# Patient Record
Sex: Male | Born: 1945 | Hispanic: Yes | Marital: Married | Smoking: Never smoker
Health system: Southern US, Community
[De-identification: ages and names within clinical notes are randomized; demographics above are authoritative.]

## PROBLEM LIST (undated history)

## (undated) DIAGNOSIS — J45909 Unspecified asthma, uncomplicated: Secondary | ICD-10-CM

## (undated) HISTORY — PX: NO PAST SURGERIES: SHX2092

---

## 2012-12-15 DIAGNOSIS — J301 Allergic rhinitis due to pollen: Secondary | ICD-10-CM | POA: Diagnosis not present

## 2012-12-15 DIAGNOSIS — H4011X0 Primary open-angle glaucoma, stage unspecified: Secondary | ICD-10-CM | POA: Diagnosis not present

## 2012-12-15 DIAGNOSIS — K1370 Unspecified lesions of oral mucosa: Secondary | ICD-10-CM | POA: Diagnosis not present

## 2012-12-15 DIAGNOSIS — J45909 Unspecified asthma, uncomplicated: Secondary | ICD-10-CM | POA: Diagnosis not present

## 2012-12-15 DIAGNOSIS — Z833 Family history of diabetes mellitus: Secondary | ICD-10-CM | POA: Diagnosis not present

## 2013-01-26 DIAGNOSIS — J45909 Unspecified asthma, uncomplicated: Secondary | ICD-10-CM | POA: Diagnosis not present

## 2013-01-26 DIAGNOSIS — H4011X0 Primary open-angle glaucoma, stage unspecified: Secondary | ICD-10-CM | POA: Diagnosis not present

## 2013-01-26 DIAGNOSIS — J301 Allergic rhinitis due to pollen: Secondary | ICD-10-CM | POA: Diagnosis not present

## 2013-01-26 DIAGNOSIS — M549 Dorsalgia, unspecified: Secondary | ICD-10-CM | POA: Diagnosis not present

## 2013-02-27 DIAGNOSIS — M545 Low back pain: Secondary | ICD-10-CM | POA: Diagnosis not present

## 2013-03-21 ENCOUNTER — Ambulatory Visit: Payer: Self-pay | Admitting: Nurse Practitioner

## 2013-03-21 DIAGNOSIS — M47817 Spondylosis without myelopathy or radiculopathy, lumbosacral region: Secondary | ICD-10-CM | POA: Diagnosis not present

## 2013-03-21 DIAGNOSIS — M5126 Other intervertebral disc displacement, lumbar region: Secondary | ICD-10-CM | POA: Diagnosis not present

## 2013-03-21 DIAGNOSIS — M545 Low back pain: Secondary | ICD-10-CM | POA: Diagnosis not present

## 2013-06-13 DIAGNOSIS — M25519 Pain in unspecified shoulder: Secondary | ICD-10-CM | POA: Diagnosis not present

## 2013-06-13 DIAGNOSIS — J45909 Unspecified asthma, uncomplicated: Secondary | ICD-10-CM | POA: Diagnosis not present

## 2013-06-13 DIAGNOSIS — M5137 Other intervertebral disc degeneration, lumbosacral region: Secondary | ICD-10-CM | POA: Diagnosis not present

## 2013-06-19 DIAGNOSIS — M67919 Unspecified disorder of synovium and tendon, unspecified shoulder: Secondary | ICD-10-CM | POA: Diagnosis not present

## 2013-07-07 ENCOUNTER — Ambulatory Visit: Payer: Self-pay | Admitting: Unknown Physician Specialty

## 2013-07-07 DIAGNOSIS — M19019 Primary osteoarthritis, unspecified shoulder: Secondary | ICD-10-CM | POA: Diagnosis not present

## 2013-07-07 DIAGNOSIS — M25519 Pain in unspecified shoulder: Secondary | ICD-10-CM | POA: Diagnosis not present

## 2013-07-07 DIAGNOSIS — S46919A Strain of unspecified muscle, fascia and tendon at shoulder and upper arm level, unspecified arm, initial encounter: Secondary | ICD-10-CM | POA: Diagnosis not present

## 2013-09-27 DIAGNOSIS — Z125 Encounter for screening for malignant neoplasm of prostate: Secondary | ICD-10-CM | POA: Diagnosis not present

## 2013-09-27 DIAGNOSIS — N529 Male erectile dysfunction, unspecified: Secondary | ICD-10-CM | POA: Diagnosis not present

## 2013-09-27 DIAGNOSIS — J45909 Unspecified asthma, uncomplicated: Secondary | ICD-10-CM | POA: Diagnosis not present

## 2013-09-27 DIAGNOSIS — Z0000 Encounter for general adult medical examination without abnormal findings: Secondary | ICD-10-CM | POA: Diagnosis not present

## 2014-01-15 DIAGNOSIS — J45909 Unspecified asthma, uncomplicated: Secondary | ICD-10-CM | POA: Diagnosis not present

## 2014-01-15 DIAGNOSIS — M25569 Pain in unspecified knee: Secondary | ICD-10-CM | POA: Diagnosis not present

## 2014-01-19 DIAGNOSIS — M1710 Unilateral primary osteoarthritis, unspecified knee: Secondary | ICD-10-CM | POA: Diagnosis not present

## 2014-01-19 DIAGNOSIS — M25569 Pain in unspecified knee: Secondary | ICD-10-CM | POA: Diagnosis not present

## 2014-09-12 DIAGNOSIS — M25561 Pain in right knee: Secondary | ICD-10-CM | POA: Diagnosis not present

## 2014-09-12 DIAGNOSIS — N529 Male erectile dysfunction, unspecified: Secondary | ICD-10-CM | POA: Diagnosis not present

## 2014-10-03 DIAGNOSIS — M5136 Other intervertebral disc degeneration, lumbar region: Secondary | ICD-10-CM | POA: Diagnosis not present

## 2014-10-03 DIAGNOSIS — Z79899 Other long term (current) drug therapy: Secondary | ICD-10-CM | POA: Diagnosis not present

## 2014-10-03 DIAGNOSIS — Z09 Encounter for follow-up examination after completed treatment for conditions other than malignant neoplasm: Secondary | ICD-10-CM | POA: Diagnosis not present

## 2014-10-03 DIAGNOSIS — N400 Enlarged prostate without lower urinary tract symptoms: Secondary | ICD-10-CM | POA: Diagnosis not present

## 2014-10-03 DIAGNOSIS — M25561 Pain in right knee: Secondary | ICD-10-CM | POA: Diagnosis not present

## 2014-10-03 DIAGNOSIS — N529 Male erectile dysfunction, unspecified: Secondary | ICD-10-CM | POA: Diagnosis not present

## 2014-12-13 DIAGNOSIS — H40003 Preglaucoma, unspecified, bilateral: Secondary | ICD-10-CM | POA: Diagnosis not present

## 2015-05-29 ENCOUNTER — Encounter: Payer: Self-pay | Admitting: Internal Medicine

## 2015-05-31 ENCOUNTER — Other Ambulatory Visit: Payer: Self-pay | Admitting: Internal Medicine

## 2015-05-31 ENCOUNTER — Encounter: Payer: Self-pay | Admitting: Internal Medicine

## 2015-05-31 DIAGNOSIS — N400 Enlarged prostate without lower urinary tract symptoms: Secondary | ICD-10-CM | POA: Insufficient documentation

## 2015-05-31 DIAGNOSIS — M5136 Other intervertebral disc degeneration, lumbar region: Secondary | ICD-10-CM | POA: Insufficient documentation

## 2015-05-31 DIAGNOSIS — M25519 Pain in unspecified shoulder: Secondary | ICD-10-CM | POA: Insufficient documentation

## 2015-05-31 DIAGNOSIS — N529 Male erectile dysfunction, unspecified: Secondary | ICD-10-CM | POA: Insufficient documentation

## 2015-05-31 DIAGNOSIS — M25569 Pain in unspecified knee: Secondary | ICD-10-CM | POA: Insufficient documentation

## 2015-05-31 DIAGNOSIS — J45909 Unspecified asthma, uncomplicated: Secondary | ICD-10-CM | POA: Insufficient documentation

## 2015-06-04 ENCOUNTER — Ambulatory Visit (INDEPENDENT_AMBULATORY_CARE_PROVIDER_SITE_OTHER): Payer: Medicare Other | Admitting: Internal Medicine

## 2015-06-04 ENCOUNTER — Encounter: Payer: Self-pay | Admitting: Internal Medicine

## 2015-06-04 VITALS — BP 130/72 | HR 80 | Ht 72.0 in | Wt 206.2 lb

## 2015-06-04 DIAGNOSIS — J4520 Mild intermittent asthma, uncomplicated: Secondary | ICD-10-CM

## 2015-06-04 MED ORDER — BUDESONIDE 90 MCG/ACT IN AEPB: 1.0000 | INHALATION_SPRAY | Freq: Two times a day (BID) | RESPIRATORY_TRACT | Status: DC

## 2015-06-04 MED ORDER — ALBUTEROL SULFATE HFA 108 (90 BASE) MCG/ACT IN AERS: 2.0000 | INHALATION_SPRAY | Freq: Four times a day (QID) | RESPIRATORY_TRACT | Status: DC | PRN

## 2015-06-04 NOTE — Progress Notes (Signed)
Date:  06/04/2015   Name:  Evan Williams   DOB:  1946-03-02   MRN:  [id number]   Chief Complaint: Asthma  patient complains of a flareup of his asthma. He attributes this mostly to postnasal drainage for which he takes Claritin-D. He has a Ventolin inhaler that he uses once or twice per day. In the past he was given Elwin Sleight which is not covered on his insurance although it helped his symptoms. It's not clear whether he has ever been on a steroid inhaler alone.  Review of Systems:  Review of Systems  HENT: Positive for sneezing. Negative for sinus pressure, sore throat and trouble swallowing.   Respiratory: Positive for wheezing. Negative for apnea, cough, chest tightness and shortness of breath.   Cardiovascular: Negative for chest pain and palpitations.  Neurological: Negative for headaches.    Patient Active Problem List   Diagnosis Date Noted  . Asthma 05/31/2015  . DDD (degenerative disc disease), lumbar 05/31/2015  . Enlarged prostate 05/31/2015  . ED (erectile dysfunction) of organic origin 05/31/2015  . Gonalgia 05/31/2015  . Pain in shoulder 05/31/2015    Prior to Admission medications   Medication Sig Start Date End Date Taking? Authorizing Provider  albuterol (VENTOLIN HFA) 108 (90 BASE) MCG/ACT inhaler Inhale into the lungs. 09/27/13  Yes Historical Provider, MD  cetirizine (ZYRTEC) 5 MG tablet Take 5 mg by mouth daily.   Yes Historical Provider, MD  mometasone-formoterol (DULERA) 100-5 MCG/ACT AERO Inhale 1 puff into the lungs 2 (two) times daily. 09/12/14  Yes Historical Provider, MD    No Known Allergies  No past surgical history on file.  Social History  Substance Use Topics  . Smoking status: Never Smoker   . Smokeless tobacco: None  . Alcohol Use: No     Medication list has been reviewed and updated.  Physical Examination:  Physical Exam  Constitutional: He is oriented to person, place, and time. He appears well-developed and well-nourished.   HENT:  Right Ear: Tympanic membrane and ear canal normal.  Left Ear: Tympanic membrane and ear canal normal.  Nose: Right sinus exhibits no maxillary sinus tenderness and no frontal sinus tenderness. Left sinus exhibits no maxillary sinus tenderness and no frontal sinus tenderness.  Mouth/Throat: Uvula is midline and oropharynx is clear and moist.  Neck: Normal range of motion. Neck supple. No thyromegaly present.  Cardiovascular: Normal rate, regular rhythm and normal heart sounds.   Pulmonary/Chest: Effort normal and breath sounds normal. No respiratory distress. He has no wheezes.  Musculoskeletal: He exhibits no edema.  Lymphadenopathy:    He has no cervical adenopathy.  Neurological: He is alert and oriented to person, place, and time.  Skin: Skin is warm and dry.  Psychiatric: He has a normal mood and affect. His behavior is normal.    BP 130/72 mmHg  Pulse 80  Ht 6' (1.829 m)  Wt 206 lb 3.2 oz (93.532 kg)  BMI 27.96 kg/m2  SpO2 99%  Assessment and Plan: 1. Asthma, mild intermittent, uncomplicated Noted coverage for dual agents available so will switch to steroid inhaler alone twice a day He can continue to use Ventolin as needed Continue Claritin for allergy symptoms - Budesonide 90 MCG/ACT inhaler; Inhale 1 puff into the lungs 2 (two) times daily.  Dispense: 1 each; Refill: 5 - albuterol (VENTOLIN HFA) 108 (90 BASE) MCG/ACT inhaler; Inhale 2 puffs into the lungs every 6 (six) hours as needed for wheezing or shortness of breath.  Dispense: 18 g; Refill:  11   Bari Edward, MD [facility]  06/04/2015

## 2015-09-02 ENCOUNTER — Other Ambulatory Visit: Payer: Self-pay

## 2015-11-01 ENCOUNTER — Ambulatory Visit (INDEPENDENT_AMBULATORY_CARE_PROVIDER_SITE_OTHER): Payer: Medicare Other | Admitting: Internal Medicine

## 2015-11-01 ENCOUNTER — Encounter: Payer: Self-pay | Admitting: Internal Medicine

## 2015-11-01 VITALS — BP 136/81 | HR 71 | Ht 72.0 in | Wt 209.0 lb

## 2015-11-01 DIAGNOSIS — M5136 Other intervertebral disc degeneration, lumbar region: Secondary | ICD-10-CM | POA: Diagnosis not present

## 2015-11-01 DIAGNOSIS — J4520 Mild intermittent asthma, uncomplicated: Secondary | ICD-10-CM | POA: Diagnosis not present

## 2015-11-01 MED ORDER — DICLOFENAC SODIUM 75 MG PO TBEC: 75.0000 mg | DELAYED_RELEASE_TABLET | Freq: Two times a day (BID) | ORAL | Status: DC

## 2015-11-01 MED ORDER — ALBUTEROL SULFATE HFA 108 (90 BASE) MCG/ACT IN AERS: 2.0000 | INHALATION_SPRAY | Freq: Four times a day (QID) | RESPIRATORY_TRACT | Status: DC | PRN

## 2015-11-01 MED ORDER — MOMETASONE FURO-FORMOTEROL FUM 100-5 MCG/ACT IN AERO: 1.0000 | INHALATION_SPRAY | Freq: Two times a day (BID) | RESPIRATORY_TRACT | Status: DC

## 2015-11-01 NOTE — Progress Notes (Signed)
Date:  11/01/2015   Name:  Evan Williams   DOB:  [birth year]   MRN:  [id number]   Chief Complaint: Asthma Asthma He complains of chest tightness, cough, difficulty breathing and wheezing. There is no shortness of breath or sputum production. This is a recurrent problem. The problem occurs every several days. The problem has been unchanged. The cough is non-productive. Pertinent negatives include no chest pain, fever or headaches. His symptoms are alleviated by steroid inhaler and beta-agonist. He reports significant improvement on treatment. His past medical history is significant for asthma. Past medical history comments: He declines flu and pneumonia vaccines.  Back Pain This is a chronic problem. The problem occurs every several days. The problem has been waxing and waning since onset. The pain is present in the lumbar spine. The quality of the pain is described as aching. Pertinent negatives include no chest pain, fever or headaches. He has tried NSAIDs for the symptoms. The treatment provided moderate (uses voltaren prn) relief.    Review of Systems  Constitutional: Negative for fever, chills and fatigue.  Respiratory: Positive for cough and wheezing. Negative for sputum production, chest tightness, shortness of breath and stridor.   Cardiovascular: Negative for chest pain and palpitations.  Gastrointestinal: Negative for diarrhea, constipation and blood in stool.  Musculoskeletal: Positive for back pain.  Neurological: Negative for dizziness and headaches.    Patient Active Problem List   Diagnosis Date Noted  . Asthma, mild intermittent 11/01/2015  . DDD (degenerative disc disease), lumbar 05/31/2015  . Enlarged prostate 05/31/2015  . ED (erectile dysfunction) of organic origin 05/31/2015  . Gonalgia 05/31/2015  . Pain in shoulder 05/31/2015    Prior to Admission medications   Medication Sig Start Date End Date Taking? Authorizing Provider  albuterol (VENTOLIN HFA)  108 (90 BASE) MCG/ACT inhaler Inhale 2 puffs into the lungs every 6 (six) hours as needed for wheezing or shortness of breath. 06/04/15   Reubin Milan, MD  Budesonide 90 MCG/ACT inhaler Inhale 1 puff into the lungs 2 (two) times daily. 06/04/15   Reubin Milan, MD  cetirizine (ZYRTEC) 5 MG tablet Take 5 mg by mouth daily.    Historical Provider, MD  mometasone-formoterol (DULERA) 100-5 MCG/ACT AERO Inhale 1 puff into the lungs 2 (two) times daily. 09/12/14   Historical Provider, MD    No Known Allergies  Past Surgical History  Procedure Laterality Date  . No past surgeries      Social History  Substance Use Topics  . Smoking status: Never Smoker   . Smokeless tobacco: None  . Alcohol Use: No    Medication list has been reviewed and updated.   Physical Exam  Constitutional: He is oriented to person, place, and time. He appears well-developed. No distress.  HENT:  Head: Normocephalic and atraumatic.  Neck: Normal range of motion. Neck supple.  Cardiovascular: Normal rate, regular rhythm and normal heart sounds.   No murmur heard. Pulmonary/Chest: Effort normal and breath sounds normal. No respiratory distress. He has no wheezes.  Musculoskeletal: Normal range of motion. He exhibits no edema or tenderness.  Neurological: He is alert and oriented to person, place, and time.  Skin: Skin is warm and dry. No rash noted.  Psychiatric: He has a normal mood and affect. His behavior is normal. Thought content normal.    BP 136/81 mmHg  Pulse 71  Ht 6' (1.829 m)  Wt 209 lb (94.802 kg)  BMI 28.34 kg/m2  SpO2 98%  Assessment and Plan: 1. Asthma, mild intermittent, uncomplicated Doing well but Elwin Sleight is not covered (he does not do well with any other medication) - mometasone-formoterol (DULERA) 100-5 MCG/ACT AERO; Inhale 1 puff into the lungs 2 (two) times daily.  Dispense: 13 g; Refill: 5 - albuterol (VENTOLIN HFA) 108 (90 Base) MCG/ACT inhaler; Inhale 2 puffs into the lungs  every 6 (six) hours as needed for wheezing or shortness of breath.  Dispense: 18 g; Refill: 11  2. DDD (degenerative disc disease), lumbar Continue nsaids PRN - diclofenac (VOLTAREN) 75 MG EC tablet; Take 1 tablet (75 mg total) by mouth 2 (two) times daily.  Dispense: 60 tablet; Refill: 5   Bari Edward, MD [facility]  11/01/2015

## 2016-01-14 ENCOUNTER — Ambulatory Visit: Payer: Self-pay | Admitting: Internal Medicine

## 2016-02-04 ENCOUNTER — Ambulatory Visit: Payer: Self-pay | Admitting: Internal Medicine

## 2016-04-08 ENCOUNTER — Ambulatory Visit: Payer: Medicare Other | Admitting: Internal Medicine

## 2016-05-20 ENCOUNTER — Encounter: Payer: Self-pay | Admitting: Internal Medicine

## 2016-05-20 ENCOUNTER — Ambulatory Visit (INDEPENDENT_AMBULATORY_CARE_PROVIDER_SITE_OTHER): Payer: Medicare Other | Admitting: Internal Medicine

## 2016-05-20 VITALS — BP 132/82 | HR 84 | Resp 16 | Ht 72.0 in | Wt 208.0 lb

## 2016-05-20 DIAGNOSIS — J4520 Mild intermittent asthma, uncomplicated: Secondary | ICD-10-CM | POA: Diagnosis not present

## 2016-05-20 DIAGNOSIS — M5136 Other intervertebral disc degeneration, lumbar region: Secondary | ICD-10-CM

## 2016-05-20 NOTE — Progress Notes (Signed)
Date:  05/20/2016   Name:  Evan Williams   DOB:  [birth year]   MRN:  [id number]   Chief Complaint: Asthma (Wants Nebulizer for flare ups.Needs refills for asthma and allergies) and Leg Pain (Pain in right leg radiates like sciatic pain could be from L4 L5 old injuries. ) Asthma  He complains of chest tightness, difficulty breathing and wheezing (rarely). There is no cough or shortness of breath. This is a recurrent problem. The current episode started more than 1 year ago. The problem occurs every several days. The problem has been waxing and waning. Pertinent negatives include no chest pain, fever or headaches. His past medical history is significant for asthma.  Back Pain  This is a chronic problem. The current episode started more than 1 year ago. The problem occurs daily. The problem is unchanged. The pain is present in the lumbar spine. The pain radiates to the right knee. Pertinent negatives include no abdominal pain, bladder incontinence, bowel incontinence, chest pain, fever, headaches, numbness or weakness. He has tried analgesics and muscle relaxant for the symptoms. The treatment provided mild relief.      Review of Systems  Constitutional: Negative for chills, fatigue and fever.  Respiratory: Positive for wheezing (rarely). Negative for cough, chest tightness and shortness of breath.   Cardiovascular: Negative for chest pain, palpitations and leg swelling.  Gastrointestinal: Negative for abdominal pain and bowel incontinence.  Genitourinary: Negative for bladder incontinence.  Musculoskeletal: Positive for back pain.  Neurological: Negative for weakness, numbness and headaches.  Psychiatric/Behavioral: Negative for dysphoric mood and sleep disturbance. The patient is not nervous/anxious.     Patient Active Problem List   Diagnosis Date Noted  . Asthma, mild intermittent 11/01/2015  . DDD (degenerative disc disease), lumbar 05/31/2015  . Enlarged prostate 05/31/2015    . ED (erectile dysfunction) of organic origin 05/31/2015  . Gonalgia 05/31/2015  . Pain in shoulder 05/31/2015    Prior to Admission medications   Medication Sig Start Date End Date Taking? Authorizing Provider  albuterol (VENTOLIN HFA) 108 (90 Base) MCG/ACT inhaler Inhale 2 puffs into the lungs every 6 (six) hours as needed for wheezing or shortness of breath. 11/01/15  Yes Reubin MilanLaura H Abygail Galeno, MD  mometasone-formoterol (DULERA) 100-5 MCG/ACT AERO Inhale 1 puff into the lungs 2 (two) times daily. 11/01/15  Yes Reubin MilanLaura H Gabryela Kimbrell, MD    No Known Allergies  Past Surgical History:  Procedure Laterality Date  . NO PAST SURGERIES      Social History  Substance Use Topics  . Smoking status: Never Smoker  . Smokeless tobacco: Never Used  . Alcohol use No     Medication list has been reviewed and updated.   Physical Exam  Constitutional: He is oriented to person, place, and time. He appears well-developed. No distress.  HENT:  Head: Normocephalic and atraumatic.  Neck: Normal range of motion. Neck supple. Carotid bruit is not present.  Cardiovascular: Normal rate, regular rhythm and normal heart sounds.   Pulmonary/Chest: Effort normal and breath sounds normal. No respiratory distress. He has no wheezes.  Neurological: He is alert and oriented to person, place, and time.  Skin: Skin is warm and dry. No rash noted.  Psychiatric: He has a normal mood and affect. His behavior is normal. Thought content normal.  Nursing note and vitals reviewed.   BP 132/82   Pulse 84   Resp 16   Ht 6' (1.829 m)   Wt 208 lb (94.3 kg)  SpO2 99%   BMI 28.21 kg/m   Assessment and Plan: 1. Asthma, mild intermittent, uncomplicated Declined Rx for nebulizer - encouraged pt to use Dulera daily and albuterol MDI as needed - CBC with Differential/Platelet - Comprehensive metabolic panel  2. DDD (degenerative disc disease), lumbar Stable; on no medication   Bari Edward, MD [facility]  05/20/2016

## 2016-05-21 LAB — COMPREHENSIVE METABOLIC PANEL
ALT: 26 IU/L (ref 0–44)
AST: 25 IU/L (ref 0–40)
Albumin/Globulin Ratio: 1.4 (ref 1.2–2.2)
Albumin: 4.2 g/dL (ref 3.6–4.8)
Alkaline Phosphatase: 163 IU/L — ABNORMAL HIGH (ref 39–117)
BUN/Creatinine Ratio: 12 (ref 10–24)
BUN: 11 mg/dL (ref 8–27)
Bilirubin Total: 0.5 mg/dL (ref 0.0–1.2)
CO2: 28 mmol/L (ref 18–29)
Calcium: 8.9 mg/dL (ref 8.6–10.2)
Chloride: 97 mmol/L (ref 96–106)
Creatinine, Ser: 0.91 mg/dL (ref 0.76–1.27)
GFR calc Af Amer: 100 mL/min/{1.73_m2} (ref >59)
GFR calc non Af Amer: 86 mL/min/{1.73_m2} (ref >59)
Globulin, Total: 2.9 g/dL (ref 1.5–4.5)
Glucose: 77 mg/dL (ref 65–99)
Potassium: 4.4 mmol/L (ref 3.5–5.2)
Sodium: 140 mmol/L (ref 134–144)
Total Protein: 7.1 g/dL (ref 6.0–8.5)

## 2016-05-21 LAB — CBC WITH DIFFERENTIAL/PLATELET
BASOS: 0 %
Basophils Absolute: 0 10*3/uL (ref 0.0–0.2)
EOS (ABSOLUTE): 1 10*3/uL — AB (ref 0.0–0.4)
Eos: 10 %
HEMOGLOBIN: 14.1 g/dL (ref 12.6–17.7)
Hematocrit: 43.2 % (ref 37.5–51.0)
IMMATURE GRANS (ABS): 0 10*3/uL (ref 0.0–0.1)
IMMATURE GRANULOCYTES: 0 %
LYMPHS: 27 %
Lymphocytes Absolute: 2.6 10*3/uL (ref 0.7–3.1)
MCH: 29.9 pg (ref 26.6–33.0)
MCHC: 32.6 g/dL (ref 31.5–35.7)
MCV: 92 fL (ref 79–97)
MONOCYTES: 5 %
Monocytes Absolute: 0.5 10*3/uL (ref 0.1–0.9)
NEUTROS ABS: 5.5 10*3/uL (ref 1.4–7.0)
NEUTROS PCT: 58 %
PLATELETS: 258 10*3/uL (ref 150–379)
RBC: 4.72 x10E6/uL (ref 4.14–5.80)
RDW: 12.4 % (ref 12.3–15.4)
WBC: 9.6 10*3/uL (ref 3.4–10.8)

## 2016-06-18 ENCOUNTER — Telehealth: Payer: Self-pay

## 2016-06-18 NOTE — Telephone Encounter (Signed)
Sent in Symbicort 80/4.5 2 puffs Bid disp 1 inhaler with 3 refills. [facility]  Left detailed message

## 2016-09-01 ENCOUNTER — Ambulatory Visit
Admission: EM | Admit: 2016-09-01 | Discharge: 2016-09-01 | Disposition: A | Discharge status: Home / Self Care | Payer: Medicare Other | Attending: Emergency Medicine | Admitting: Emergency Medicine

## 2016-09-01 ENCOUNTER — Encounter: Payer: Self-pay | Admitting: *Deleted

## 2016-09-01 ENCOUNTER — Ambulatory Visit: Payer: Medicare Other

## 2016-09-01 DIAGNOSIS — J45909 Unspecified asthma, uncomplicated: Secondary | ICD-10-CM | POA: Diagnosis not present

## 2016-09-01 DIAGNOSIS — M25512 Pain in left shoulder: Secondary | ICD-10-CM | POA: Diagnosis not present

## 2016-09-01 HISTORY — DX: Unspecified asthma, uncomplicated: J45.909

## 2016-09-01 MED ORDER — TRAMADOL HCL 50 MG PO TABS: ORAL_TABLET | ORAL | 0 refills | Status: DC

## 2016-09-01 MED ORDER — IBUPROFEN 800 MG PO TABS: 800.0000 mg | ORAL_TABLET | Freq: Three times a day (TID) | ORAL | 0 refills | Status: AC | PRN

## 2016-09-01 NOTE — ED Provider Notes (Signed)
HPI  SUBJECTIVE:  Evan Williams is a right-handed 70 y.o. male who presents with 1 month of daily, constant, achy left shoulder pain described as soreness after pulling a pipe with his left arm. States that the handle broke off and his arm "jerked back into my shoulder. "Symptoms are worse with lifting arm, abduction, better with holding his arm flexed against his body. He has tried diclofenac 75 mg twice a day, gentle range of motion and rest. He states that he is not doing any upper body activity this time. He reports numbness in his middle finger And weakness secondary to the pain. No other weakness. No redness, swelling, fevers. He states that the symptoms are not getting any worse, but they are not going away. He has a past medical history negative for kidney disease, GI bleed, diabetes, hypertension. PMD: Dr. Bari EdwardLaura Berglund, MD Orthopedic surgeon: None.   Past Medical History:  Diagnosis Date  . Asthma     Past Surgical History:  Procedure Laterality Date  . NO PAST SURGERIES      Family History  Problem Relation Age of Onset  . Diabetes Mother     Social History  Substance Use Topics  . Smoking status: Never Smoker  . Smokeless tobacco: Never Used  . Alcohol use No    No current facility-administered medications for this encounter.   Current Outpatient Prescriptions:  .  albuterol (VENTOLIN HFA) 108 (90 Base) MCG/ACT inhaler, Inhale 2 puffs into the lungs every 6 (six) hours as needed for wheezing or shortness of breath., Disp: 18 g, Rfl: 11 .  mometasone-formoterol (DULERA) 100-5 MCG/ACT AERO, Inhale 1 puff into the lungs 2 (two) times daily., Disp: 13 g, Rfl: 5 .  ibuprofen (ADVIL,MOTRIN) 800 MG tablet, Take 1 tablet (800 mg total) by mouth every 8 (eight) hours as needed., Disp: 30 tablet, Rfl: 0 .  traMADol (ULTRAM) 50 MG tablet, 1-2 tabs po q 6 hr prn pain Maximum dose= 8 tablets per day, Disp: 20 tablet, Rfl: 0  No Known Allergies   ROS  As noted in HPI.    Physical Exam  BP (!) 150/78 (BP Location: Left Arm)   Pulse 84   Temp 98.3 F (36.8 C) (Oral)   Resp 16   Ht 6' (1.829 m)   Wt 200 lb (90.7 kg)   SpO2 99%   BMI 27.12 kg/m   Constitutional: Well developed, well nourished, no acute distress Eyes:  EOMI, conjunctiva normal bilaterally HENT: Normocephalic, atraumatic,mucus membranes moist Respiratory: Normal inspiratory effort Cardiovascular: Normal rate GI: nondistended skin: No rash, skin intact Musculoskeletal: Shoulders symmetric on inspection. No erythema, swelling. L shoulder with ROM normal , Drop test normal , clavicle NT , A/C joint NT, scapula NT, tenderness Of biceps tendon and along supraspinatus, proximal humerus NT, Motor strength normal, Sensation intact LT over deltoid region, distal NVI with hand on affected side having intact sensation and strength in the distribution of the median, radial, and ulnar nerve.  no pain with internal rotation, pain with external rotation, positive empty can test positive liftoff test, negative "popeye" sign, no instability with abduction/external rotation. Neurologic: Alert & oriented x 3, no focal neuro deficits Psychiatric: Speech and behavior appropriate   ED Course   Medications - No data to display  Orders Placed This Encounter  Procedures  . DG Shoulder Left    Standing Status:   Standing    Number of Occurrences:   1    Order Specific Question:  Reason for Exam (SYMPTOM  OR DIAGNOSIS REQUIRED)    Answer:   r/o acute fx, dislocation, bankart  . Ambulatory referral to Orthopedic Surgery    Referral Priority:   Urgent    Referral Type:   Surgical    Referral Reason:   Second Opinion    Requested Specialty:   Orthopedic Surgery    Number of Visits Requested:   1    No results found for this or any previous visit (from the past 24 hour(s)). Dg Shoulder Left  Result Date: 09/01/2016 CLINICAL DATA:  Left shoulder pain. EXAM: LEFT SHOULDER - 2+ VIEW COMPARISON:  None.  FINDINGS: There is no fracture or dislocation. 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 There are mild degenerative changes of the acromioclavicular joint. IMPRESSION: No acute osseous injury of the left shoulder. Electronically Signed   By: Elige KoHetal  Patel   On: 09/01/2016 13:02    ED Clinical Impression  Acute pain of left shoulder   ED Assessment/Plan  Concern for rotator cuff lesion versus biceps tendinitis versus a bursitis of the shoulder.  X-ray  independently reviewed. No fracture, dislocation. See radiology report for details.  We'll have patient discontinue diclofenac, start ibuprofen 800 mg  With 1 g of Tylenol 3 times a day, tramadol as needed for severe pain, sleep with a pillow between his arm and his body at night, follow-up with Dr. Hyacinth MeekerMiller, orthopedic surgeon on call in a week to 10 days. Discussed  imaging, MDM, plan and followup with patient. Patient agrees with plan.   Meds ordered this encounter  Medications  . DISCONTD: diclofenac (VOLTAREN) 75 MG EC tablet    Sig: Take 75 mg by mouth 2 (two) times daily.  . traMADol (ULTRAM) 50 MG tablet    Sig: 1-2 tabs po q 6 hr prn pain Maximum dose= 8 tablets per day    Dispense:  20 tablet    Refill:  0  . ibuprofen (ADVIL,MOTRIN) 800 MG tablet    Sig: Take 1 tablet (800 mg total) by mouth every 8 (eight) hours as needed.    Dispense:  30 tablet    Refill:  0    *This clinic note was created using Scientist, clinical (histocompatibility and immunogenetics)Dragon dictation software. Therefore, there may be occasional mistakes despite careful proofreading.  ?   Domenick GongAshley Yordan Martindale, MD 09/01/16 1315

## 2016-09-01 NOTE — ED Triage Notes (Signed)
While "pulling" on a pipe pt felt pain in his left shoulder. Pain has not resolved and worsened until today.

## 2016-09-01 NOTE — Discharge Instructions (Signed)
discontinue diclofenac, start ibuprofen 800 mg  With 1 g of Tylenol 3 times a day, tramadol as needed for severe pain, sleep with a pillow between your arm and his body at night, follow-up with Dr. Hyacinth MeekerMiller, at [facility] in a week to 10 days.

## 2016-10-07 ENCOUNTER — Encounter: Payer: Self-pay | Admitting: Internal Medicine

## 2016-10-07 ENCOUNTER — Ambulatory Visit (INDEPENDENT_AMBULATORY_CARE_PROVIDER_SITE_OTHER): Payer: Medicare Other | Admitting: Internal Medicine

## 2016-10-07 VITALS — BP 132/82 | HR 98 | Temp 97.9°F | Ht 72.0 in | Wt 207.0 lb

## 2016-10-07 DIAGNOSIS — Z0000 Encounter for general adult medical examination without abnormal findings: Secondary | ICD-10-CM | POA: Diagnosis not present

## 2016-10-07 DIAGNOSIS — N400 Enlarged prostate without lower urinary tract symptoms: Secondary | ICD-10-CM

## 2016-10-07 DIAGNOSIS — E782 Mixed hyperlipidemia: Secondary | ICD-10-CM

## 2016-10-07 DIAGNOSIS — M25512 Pain in left shoulder: Secondary | ICD-10-CM

## 2016-10-07 DIAGNOSIS — M5136 Other intervertebral disc degeneration, lumbar region: Secondary | ICD-10-CM

## 2016-10-07 DIAGNOSIS — J4520 Mild intermittent asthma, uncomplicated: Secondary | ICD-10-CM | POA: Diagnosis not present

## 2016-10-07 LAB — POC URINALYSIS WITH MICROSCOPIC (NON AUTO)MANUAL RESULT
BILIRUBIN UA: NEGATIVE
Bacteria, UA: 0
Crystals: 0
EPITHELIAL CELLS, URINE PER MICROSCOPY: 0
Glucose, UA: NEGATIVE
KETONES UA: NEGATIVE
LEUKOCYTES UA: NEGATIVE
MUCUS UA: 0
NITRITE UA: NEGATIVE
PH UA: 7.5
RBC: 0 M/uL — AB (ref 4.69–6.13)
UROBILINOGEN UA: 0.2
WBC CASTS UA: 0

## 2016-10-07 MED ORDER — BUDESONIDE-FORMOTEROL FUMARATE 160-4.5 MCG/ACT IN AERO: 2.0000 | INHALATION_SPRAY | Freq: Two times a day (BID) | RESPIRATORY_TRACT | 12 refills | Status: AC

## 2016-10-07 MED ORDER — ALBUTEROL SULFATE HFA 108 (90 BASE) MCG/ACT IN AERS: 2.0000 | INHALATION_SPRAY | Freq: Four times a day (QID) | RESPIRATORY_TRACT | 11 refills | Status: AC | PRN

## 2016-10-07 MED ORDER — MOMETASONE FURO-FORMOTEROL FUM 100-5 MCG/ACT IN AERO: 1.0000 | INHALATION_SPRAY | Freq: Two times a day (BID) | RESPIRATORY_TRACT | 5 refills | Status: DC

## 2016-10-07 NOTE — Progress Notes (Signed)
Patient: Evan Williams, Male    DOB: 1946-09-05, 71 y.o.   MRN: [id number] Visit Date: 10/07/2016  Today's Provider: Bari Edward, MD   Chief Complaint  Patient presents with  . Medicare Wellness   Subjective:    Annual wellness visit Evan Williams is a 71 y.o. male who presents today for his Subsequent Annual Wellness Visit. He feels fairly well. He reports exercising none regularly. He reports he is sleeping well. He is mostly bothered by persistent left shoulder pain.  ----------------------------------------------------------- Shoulder Pain   This is a new problem. The current episode started more than 1 month ago. There has been a history of trauma. The problem occurs constantly.  COPD/asthma- using albuterol nightly now.  Elwin Sleight works but not covered.  He may be able to benefit from symbicort. He had shoulder xray last month in [facility] - showed mild OA but no fracture or acute problem.  It is still painful to movement but not swollen.  He took Aleve with no benefit.  Review of Systems  Constitutional: Negative for appetite change, chills, diaphoresis, fatigue and unexpected weight change.  HENT: Negative for hearing loss, tinnitus, trouble swallowing and voice change.   Eyes: Negative for visual disturbance.  Respiratory: Positive for shortness of breath and wheezing. Negative for choking.   Cardiovascular: Negative for chest pain, palpitations and leg swelling.  Gastrointestinal: Negative for abdominal pain, blood in stool, constipation and diarrhea.  Genitourinary: Positive for frequency. Negative for difficulty urinating and dysuria.       Nocturia x 2-3  Musculoskeletal: Positive for arthralgias (left shoulder) and back pain (sciatica). Negative for myalgias.  Skin: Negative for color change and rash.  Neurological: Negative for dizziness, syncope and headaches.  Hematological: Negative for adenopathy.  Psychiatric/Behavioral: Negative for dysphoric mood and  sleep disturbance.    Social History   Social History  . Marital status: Married    Spouse name: N/A  . Number of children: N/A  . Years of education: N/A   Occupational History  . Not on file.   Social History Main Topics  . Smoking status: Never Smoker  . Smokeless tobacco: Never Used  . Alcohol use No  . Drug use: No  . Sexual activity: Not on file   Other Topics Concern  . Not on file   Social History Narrative  . No narrative on file    Patient Active Problem List   Diagnosis Date Noted  . Asthma, mild intermittent 11/01/2015  . DDD (degenerative disc disease), lumbar 05/31/2015  . Enlarged prostate 05/31/2015  . ED (erectile dysfunction) of organic origin 05/31/2015  . Gonalgia 05/31/2015  . Pain in shoulder 05/31/2015    Past Surgical History:  Procedure Laterality Date  . NO PAST SURGERIES      His family history includes Diabetes in his mother.     Previous Medications   IBUPROFEN (ADVIL,MOTRIN) 800 MG TABLET    Take 1 tablet (800 mg total) by mouth every 8 (eight) hours as needed.    Patient Care Team: Reubin Milan, MD as PCP - General (Family Medicine)      Objective:   Vitals: BP 132/82   Pulse 98   Temp 97.9 F (36.6 C)   Ht 6' (1.829 m)   Wt 207 lb (93.9 kg)   SpO2 99%   BMI 28.07 kg/m   Physical Exam  Constitutional: He is oriented to person, place, and time. He appears well-developed and well-nourished.  HENT:  Head: Normocephalic.  Right Ear: Tympanic membrane, external ear and ear canal normal.  Left Ear: Tympanic membrane, external ear and ear canal normal.  Nose: Nose normal.  Mouth/Throat: Uvula is midline and oropharynx is clear and moist.  Eyes: Conjunctivae and EOM are normal. Pupils are equal, round, and reactive to light.  Neck: Normal range of motion. Neck supple. Carotid bruit is not present. No thyromegaly present.  Cardiovascular: Normal rate, regular rhythm, normal heart sounds and intact distal pulses.     Pulmonary/Chest: Effort normal and breath sounds normal. He has no wheezes. Right breast exhibits no mass. Left breast exhibits no mass.  Abdominal: Soft. Normal appearance and bowel sounds are normal. There is no hepatosplenomegaly. There is no tenderness.  Musculoskeletal:       Left shoulder: He exhibits decreased range of motion and tenderness. He exhibits no effusion, no deformity, normal pulse and normal strength.       Lumbar back: He exhibits decreased range of motion and tenderness. He exhibits no spasm.  Lymphadenopathy:    He has no cervical adenopathy.  Neurological: He is alert and oriented to person, place, and time. He has normal reflexes.  Skin: Skin is warm, dry and intact.  Psychiatric: He has a normal mood and affect. His speech is normal and behavior is normal. Judgment and thought content normal.  Nursing note and vitals reviewed.   Activities of Daily Living In your present state of health, do you have any difficulty performing the following activities: 10/07/2016  Hearing? N  Vision? N  Difficulty concentrating or making decisions? N  Walking or climbing stairs? N  Dressing or bathing? N  Doing errands, shopping? N  Preparing Food and eating ? N  Using the Toilet? N  In the past six months, have you accidently leaked urine? N  Do you have problems with loss of bowel control? N  Managing your Medications? N  Managing your Finances? N  Housekeeping or managing your Housekeeping? N  Some recent data might be hidden    Fall Risk Assessment Fall Risk  10/07/2016 05/20/2016 06/04/2015  Falls in the past year? No No No      Depression Screen PHQ 2/9 Scores 10/07/2016 05/20/2016 06/04/2015  PHQ - 2 Score 0 0 0    6CIT Screen 10/07/2016  What Year? 0 points  What month? 0 points  What time? 0 points  Count back from 20 0 points  Months in reverse 0 points     Medicare Annual Wellness Visit Summary:  Reviewed patient's Family Medical History Reviewed and  updated list of patient's medical providers Assessment of cognitive impairment was done Assessed patient's functional ability Established a written schedule for health screening services Health Risk Assessent Completed and Reviewed  Exercise Activities and Dietary recommendations Goals    . <enter goal here>          Stay healthy       Immunization History  Administered Date(s) Administered  . Pneumococcal Polysaccharide-23 09/16/2011  . Tdap 09/16/2011    Health Maintenance  Topic Date Due  . Hepatitis C Screening  10/07/2017 (Originally [birth year])  . COLONOSCOPY  09/15/2017  . TETANUS/TDAP  09/15/2021  . INFLUENZA VACCINE  Excluded  . ZOSTAVAX  Excluded  . PNA vac Low Risk Adult  Excluded    Discussed health benefits of physical activity, and encouraged him to engage in regular exercise appropriate for his age and condition.    ------------------------------------------------------------------------------------------------------------  Assessment & Plan:  1. Medicare annual wellness visit, subsequent  Measures satisfied Patient declines flu and prevnar-13 - Comprehensive metabolic panel - POC urinalysis w microscopic (non auto)  2. Mild intermittent asthma without complication Need to start controller medication - CBC with Differential/Platelet - albuterol (VENTOLIN HFA) 108 (90 Base) MCG/ACT inhaler; Inhale 2 puffs into the lungs every 6 (six) hours as needed for wheezing or shortness of breath.  Dispense: 18 g; Refill: 11 - budesonide-formoterol (SYMBICORT) 160-4.5 MCG/ACT inhaler; Inhale 2 puffs into the lungs 2 (two) times daily.  Dispense: 1 Inhaler; Refill: 12  3. Enlarged prostate Declines DRE - PSA  4. DDD (degenerative disc disease), lumbar Stable; on workmans compensation from 1980  5. Hyperlipidemia, mixed Will advise if medication is needed - Lipid panel  6. Acute pain of left shoulder Refer to ortho - Ambulatory referral to Orthopedic  Surgery   Bari EdwardLaura Vershawn Westrup, MD [facility]  10/07/2016

## 2016-10-07 NOTE — Patient Instructions (Signed)
Health Maintenance  Topic Date Due  . Hepatitis C Screening  1946-06-15  . COLONOSCOPY  09/15/2017  . TETANUS/TDAP  09/15/2021  . INFLUENZA VACCINE  Excluded  . ZOSTAVAX  Excluded  . PNA vac Low Risk Adult  Excluded

## 2016-10-08 LAB — COMPREHENSIVE METABOLIC PANEL
ALBUMIN: 4.1 g/dL (ref 3.6–4.8)
ALT: 38 IU/L (ref 0–44)
AST: 32 IU/L (ref 0–40)
Albumin/Globulin Ratio: 1.2 (ref 1.2–2.2)
Alkaline Phosphatase: 137 IU/L — ABNORMAL HIGH (ref 39–117)
BUN/Creatinine Ratio: 11 (ref 10–24)
BUN: 10 mg/dL (ref 8–27)
Bilirubin Total: 0.8 mg/dL (ref 0.0–1.2)
CALCIUM: 9.4 mg/dL (ref 8.6–10.2)
CO2: 29 mmol/L (ref 18–29)
CREATININE: 0.88 mg/dL (ref 0.76–1.27)
Chloride: 99 mmol/L (ref 96–106)
GFR, EST AFRICAN AMERICAN: 101 mL/min/{1.73_m2} (ref >59)
GFR, EST NON AFRICAN AMERICAN: 88 mL/min/{1.73_m2} (ref >59)
GLOBULIN, TOTAL: 3.4 g/dL (ref 1.5–4.5)
Glucose: 85 mg/dL (ref 65–99)
Potassium: 4.7 mmol/L (ref 3.5–5.2)
SODIUM: 140 mmol/L (ref 134–144)
TOTAL PROTEIN: 7.5 g/dL (ref 6.0–8.5)

## 2016-10-08 LAB — CBC WITH DIFFERENTIAL/PLATELET
BASOS: 0 %
Basophils Absolute: 0 10*3/uL (ref 0.0–0.2)
EOS (ABSOLUTE): 0.5 10*3/uL — ABNORMAL HIGH (ref 0.0–0.4)
EOS: 5 %
HEMATOCRIT: 46.6 % (ref 37.5–51.0)
Hemoglobin: 14.9 g/dL (ref 13.0–17.7)
IMMATURE GRANS (ABS): 0 10*3/uL (ref 0.0–0.1)
IMMATURE GRANULOCYTES: 0 %
LYMPHS: 21 %
Lymphocytes Absolute: 2 10*3/uL (ref 0.7–3.1)
MCH: 29.6 pg (ref 26.6–33.0)
MCHC: 32 g/dL (ref 31.5–35.7)
MCV: 93 fL (ref 79–97)
MONOCYTES: 6 %
Monocytes Absolute: 0.6 10*3/uL (ref 0.1–0.9)
NEUTROS PCT: 68 %
Neutrophils Absolute: 6.6 10*3/uL (ref 1.4–7.0)
Platelets: 281 10*3/uL (ref 150–379)
RBC: 5.03 x10E6/uL (ref 4.14–5.80)
RDW: 12.6 % (ref 12.3–15.4)
WBC: 9.7 10*3/uL (ref 3.4–10.8)

## 2016-10-08 LAB — LIPID PANEL
CHOL/HDL RATIO: 4.1 ratio (ref 0.0–5.0)
Cholesterol, Total: 158 mg/dL (ref 100–199)
HDL: 39 mg/dL — ABNORMAL LOW (ref >39)
LDL Calculated: 92 mg/dL (ref 0–99)
TRIGLYCERIDES: 134 mg/dL (ref 0–149)
VLDL Cholesterol Cal: 27 mg/dL (ref 5–40)

## 2016-10-08 LAB — PSA: Prostate Specific Ag, Serum: 2.5 ng/mL (ref 0.0–4.0)

## 2016-10-14 DIAGNOSIS — M170 Bilateral primary osteoarthritis of knee: Secondary | ICD-10-CM | POA: Diagnosis not present

## 2016-10-14 DIAGNOSIS — M7552 Bursitis of left shoulder: Secondary | ICD-10-CM | POA: Diagnosis not present

## 2016-10-14 DIAGNOSIS — M25512 Pain in left shoulder: Secondary | ICD-10-CM | POA: Diagnosis not present

## 2016-10-19 DIAGNOSIS — H40003 Preglaucoma, unspecified, bilateral: Secondary | ICD-10-CM | POA: Diagnosis not present

## 2017-06-22 DIAGNOSIS — Y92018 Other place in single-family (private) house as the place of occurrence of the external cause: Secondary | ICD-10-CM | POA: Diagnosis not present

## 2017-06-22 DIAGNOSIS — S0083XA Contusion of other part of head, initial encounter: Secondary | ICD-10-CM | POA: Diagnosis not present

## 2017-06-22 DIAGNOSIS — S0091XA Abrasion of unspecified part of head, initial encounter: Secondary | ICD-10-CM | POA: Diagnosis not present

## 2017-06-22 DIAGNOSIS — Y998 Other external cause status: Secondary | ICD-10-CM | POA: Diagnosis not present

## 2017-06-22 DIAGNOSIS — S40011A Contusion of right shoulder, initial encounter: Secondary | ICD-10-CM | POA: Diagnosis not present

## 2017-06-22 DIAGNOSIS — Y9389 Activity, other specified: Secondary | ICD-10-CM | POA: Diagnosis not present

## 2017-06-22 DIAGNOSIS — S4992XA Unspecified injury of left shoulder and upper arm, initial encounter: Secondary | ICD-10-CM | POA: Diagnosis not present

## 2017-06-22 DIAGNOSIS — W1789XA Other fall from one level to another, initial encounter: Secondary | ICD-10-CM | POA: Diagnosis not present

## 2017-06-22 DIAGNOSIS — Z789 Other specified health status: Secondary | ICD-10-CM | POA: Diagnosis not present

## 2018-09-02 IMAGING — CR DG SHOULDER 2+V*L*
3 series · 4 of 4 positions shown · non-contrast
Comparison: None.

CLINICAL DATA: Left shoulder pain.

EXAM:
LEFT SHOULDER - 2+ VIEW

[shoulder grashey]
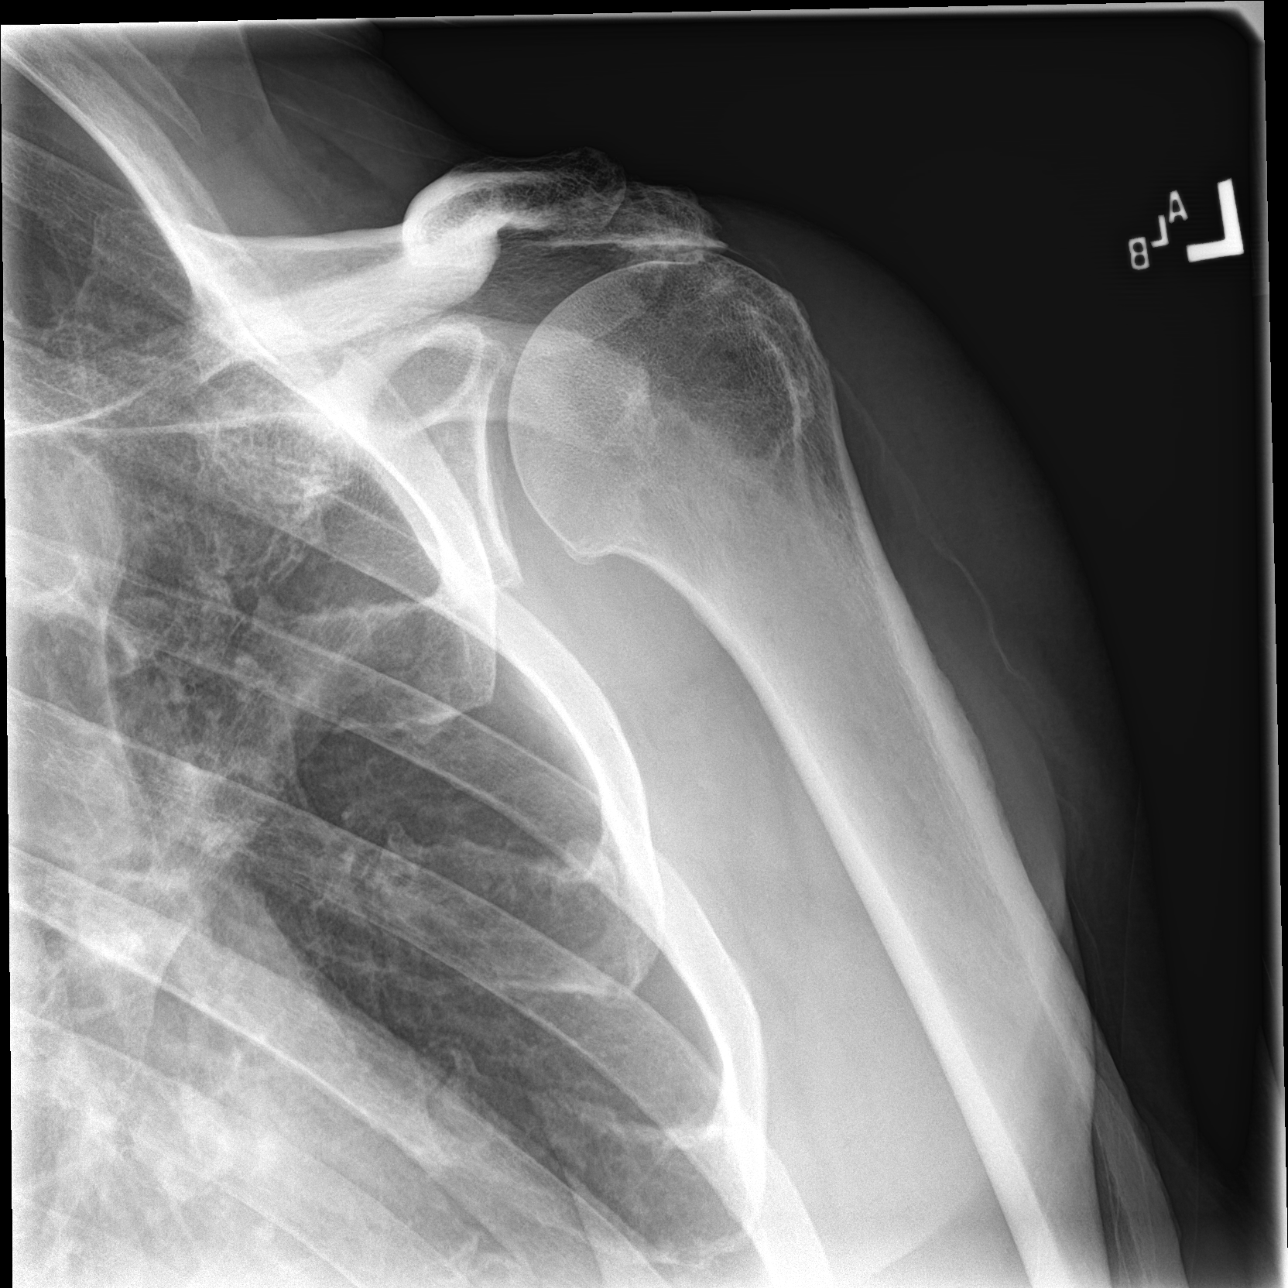

[Series 2: shoulder y view · 0.14mm/px · 2 of 2 slices shown]
[im 1/2]
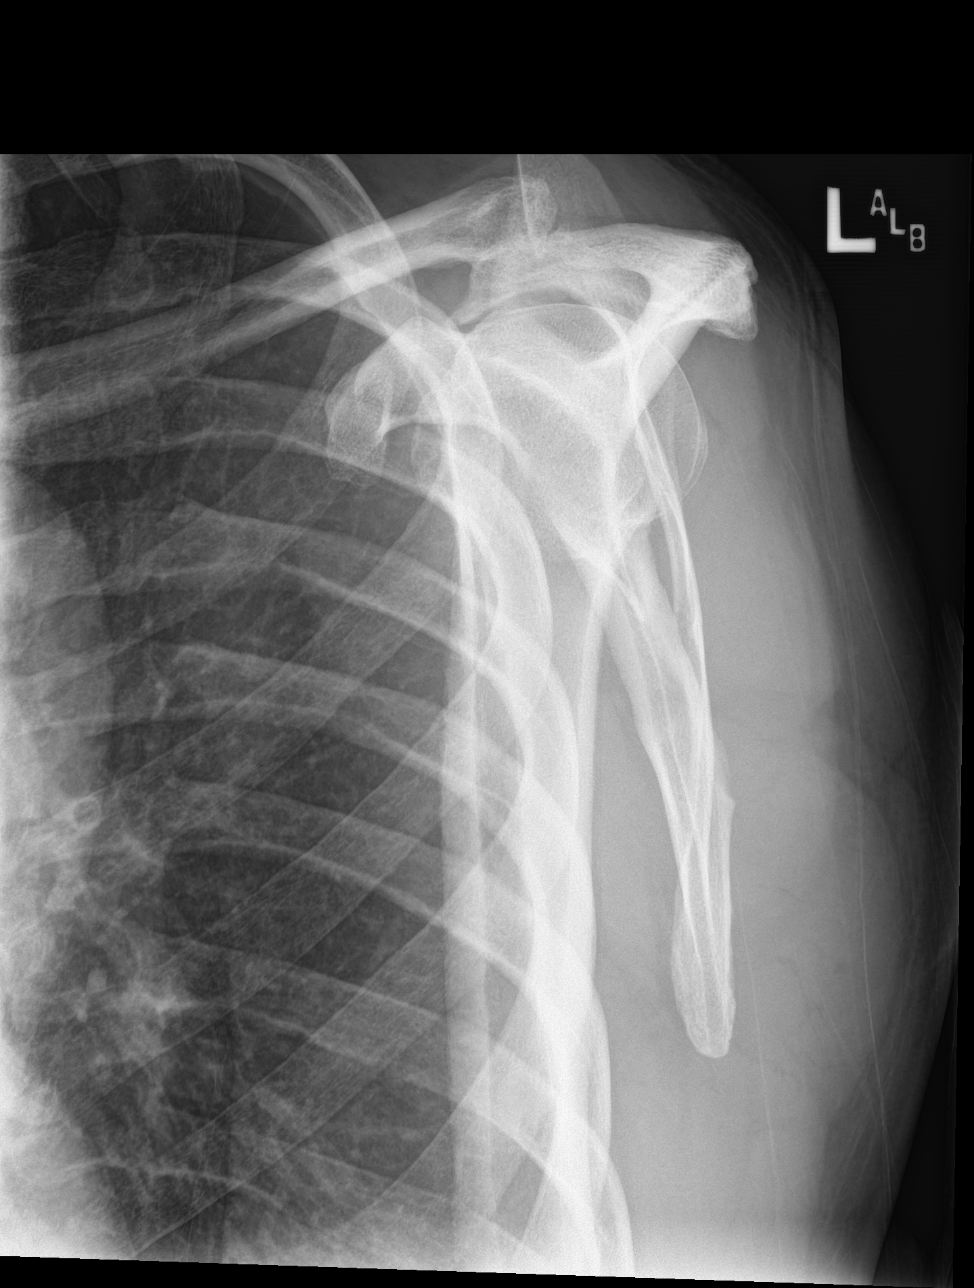
[im 2/2]
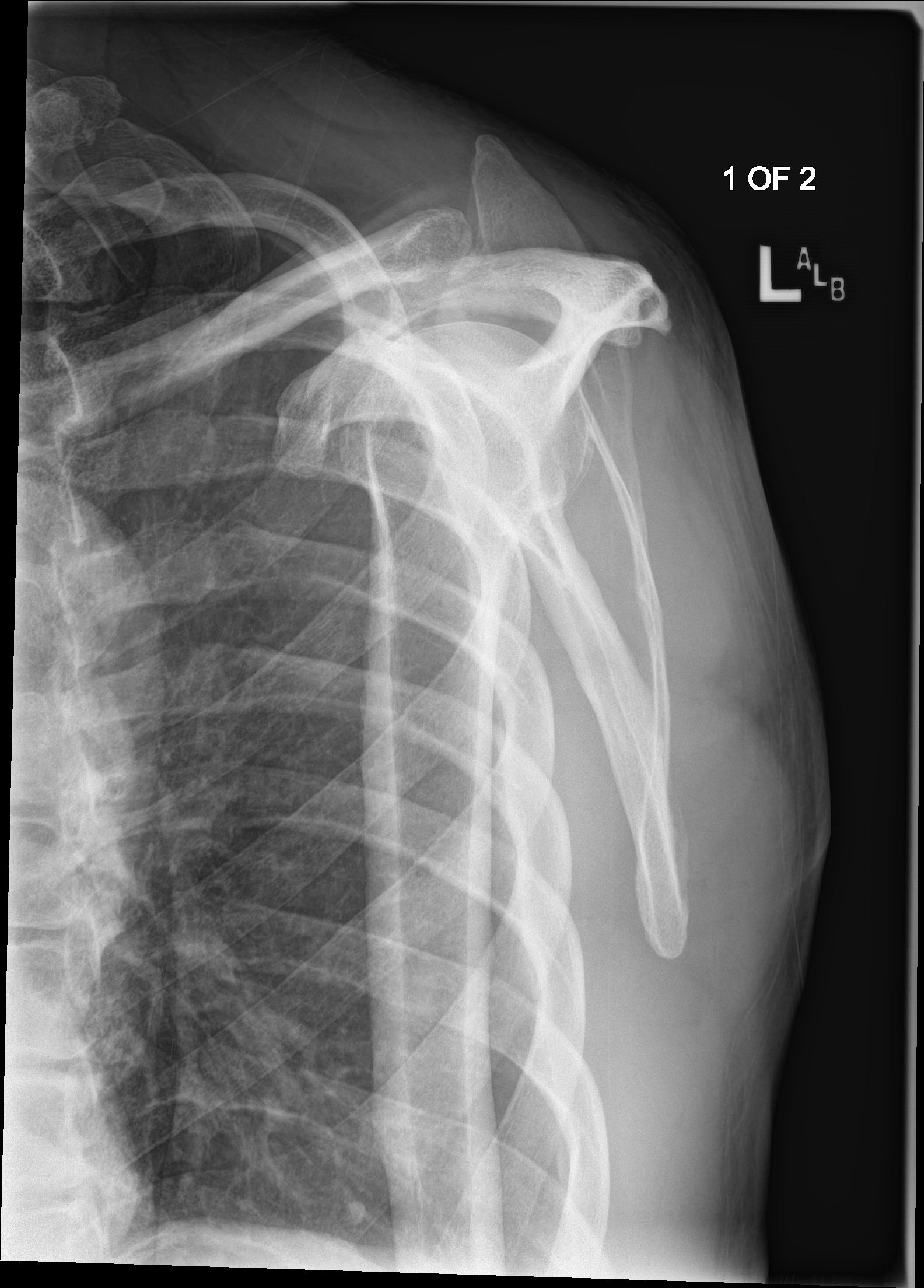

[shoulder axial]
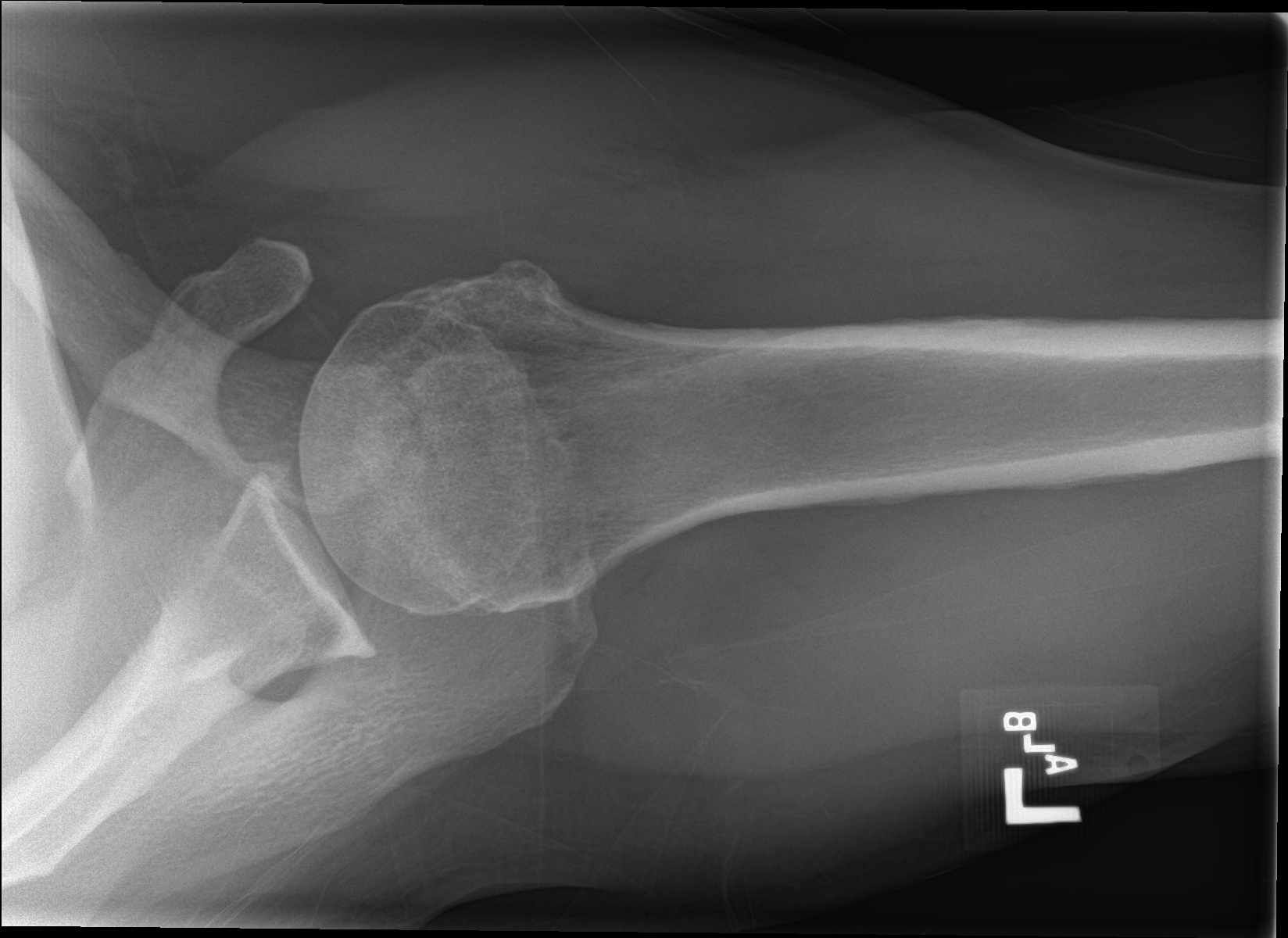

[4 of 4 positions shown; findings below may reference images not displayed]

FINDINGS: There is no fracture or dislocation. There is mild osteoarthritis of
the glenohumeral joint. There are mild degenerative changes of the
acromioclavicular joint.
IMPRESSION: No acute osseous injury of the left shoulder.

## 2018-10-24 ENCOUNTER — Telehealth: Payer: Self-pay | Admitting: Internal Medicine

## 2018-10-24 NOTE — Telephone Encounter (Signed)
Called to schedule Medicare Annual Wellness Visit with the Nurse Health Advisor. No answer on mobile number.  Left message on mobile voicemail box.  Tried to call home number at 3:16 PM, line was BUSY.  If patient returns call, please note: their last AWV was on 10/07/2016, please schedule AWV with [facility] any date AFTER 10/07/2017.  Thank you! For any questions please contact: Trixie Rude at [phone] or Skype [email or web address]
